# Patient Record
Sex: Female | Born: 2000 | Race: White | Hispanic: No | Marital: Single | Smoking: Never smoker
Health system: Southern US, Community
[De-identification: ages and names within clinical notes are randomized; demographics above are authoritative.]

## PROBLEM LIST (undated history)

## (undated) DIAGNOSIS — F909 Attention-deficit hyperactivity disorder, unspecified type: Secondary | ICD-10-CM

---
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
## 2018-01-10 DIAGNOSIS — L509 Urticaria, unspecified: Secondary | ICD-10-CM | POA: Diagnosis not present

## 2018-01-10 DIAGNOSIS — M791 Myalgia, unspecified site: Secondary | ICD-10-CM | POA: Diagnosis not present

## 2018-01-30 DIAGNOSIS — Z23 Encounter for immunization: Secondary | ICD-10-CM | POA: Diagnosis not present

## 2018-03-04 DIAGNOSIS — F411 Generalized anxiety disorder: Secondary | ICD-10-CM | POA: Diagnosis not present

## 2018-03-11 DIAGNOSIS — F411 Generalized anxiety disorder: Secondary | ICD-10-CM | POA: Diagnosis not present

## 2018-06-10 DIAGNOSIS — Z113 Encounter for screening for infections with a predominantly sexual mode of transmission: Secondary | ICD-10-CM | POA: Diagnosis not present

## 2018-06-10 DIAGNOSIS — Z118 Encounter for screening for other infectious and parasitic diseases: Secondary | ICD-10-CM | POA: Diagnosis not present

## 2018-06-10 DIAGNOSIS — Z6822 Body mass index (BMI) 22.0-22.9, adult: Secondary | ICD-10-CM | POA: Diagnosis not present

## 2018-06-10 DIAGNOSIS — Z01419 Encounter for gynecological examination (general) (routine) without abnormal findings: Secondary | ICD-10-CM | POA: Diagnosis not present

## 2018-10-09 DIAGNOSIS — R300 Dysuria: Secondary | ICD-10-CM | POA: Diagnosis not present

## 2018-10-09 DIAGNOSIS — N760 Acute vaginitis: Secondary | ICD-10-CM | POA: Diagnosis not present

## 2018-10-20 DIAGNOSIS — N390 Urinary tract infection, site not specified: Secondary | ICD-10-CM | POA: Diagnosis not present

## 2018-10-20 DIAGNOSIS — Z202 Contact with and (suspected) exposure to infections with a predominantly sexual mode of transmission: Secondary | ICD-10-CM | POA: Diagnosis not present

## 2019-01-27 DIAGNOSIS — Z3046 Encounter for surveillance of implantable subdermal contraceptive: Secondary | ICD-10-CM | POA: Diagnosis not present

## 2019-01-27 DIAGNOSIS — Z118 Encounter for screening for other infectious and parasitic diseases: Secondary | ICD-10-CM | POA: Diagnosis not present

## 2019-01-27 DIAGNOSIS — Z30017 Encounter for initial prescription of implantable subdermal contraceptive: Secondary | ICD-10-CM | POA: Diagnosis not present

## 2019-01-27 DIAGNOSIS — Z3049 Encounter for surveillance of other contraceptives: Secondary | ICD-10-CM | POA: Diagnosis not present

## 2019-02-04 DIAGNOSIS — Z3046 Encounter for surveillance of implantable subdermal contraceptive: Secondary | ICD-10-CM | POA: Diagnosis not present

## 2019-02-04 DIAGNOSIS — Z118 Encounter for screening for other infectious and parasitic diseases: Secondary | ICD-10-CM | POA: Diagnosis not present

## 2019-02-10 DIAGNOSIS — R21 Rash and other nonspecific skin eruption: Secondary | ICD-10-CM | POA: Diagnosis not present

## 2019-06-16 DIAGNOSIS — J029 Acute pharyngitis, unspecified: Secondary | ICD-10-CM | POA: Diagnosis not present

## 2019-06-16 DIAGNOSIS — Z20828 Contact with and (suspected) exposure to other viral communicable diseases: Secondary | ICD-10-CM | POA: Diagnosis not present

## 2019-10-16 DIAGNOSIS — S63642A Sprain of metacarpophalangeal joint of left thumb, initial encounter: Secondary | ICD-10-CM | POA: Diagnosis not present

## 2019-10-16 DIAGNOSIS — S6992XA Unspecified injury of left wrist, hand and finger(s), initial encounter: Secondary | ICD-10-CM | POA: Diagnosis not present

## 2019-10-16 DIAGNOSIS — W19XXXA Unspecified fall, initial encounter: Secondary | ICD-10-CM | POA: Diagnosis not present

## 2019-10-20 DIAGNOSIS — M79645 Pain in left finger(s): Secondary | ICD-10-CM | POA: Diagnosis not present

## 2019-11-05 DIAGNOSIS — M79645 Pain in left finger(s): Secondary | ICD-10-CM | POA: Diagnosis not present

## 2021-01-09 DIAGNOSIS — R195 Other fecal abnormalities: Secondary | ICD-10-CM | POA: Diagnosis not present

## 2021-01-09 DIAGNOSIS — R197 Diarrhea, unspecified: Secondary | ICD-10-CM | POA: Diagnosis not present

## 2021-01-09 DIAGNOSIS — R140 Abdominal distension (gaseous): Secondary | ICD-10-CM | POA: Diagnosis not present

## 2021-01-10 DIAGNOSIS — R195 Other fecal abnormalities: Secondary | ICD-10-CM | POA: Diagnosis not present

## 2021-01-26 DIAGNOSIS — R197 Diarrhea, unspecified: Secondary | ICD-10-CM | POA: Diagnosis not present

## 2021-01-26 DIAGNOSIS — R195 Other fecal abnormalities: Secondary | ICD-10-CM | POA: Diagnosis not present

## 2021-02-22 DIAGNOSIS — K580 Irritable bowel syndrome with diarrhea: Secondary | ICD-10-CM | POA: Diagnosis not present

## 2021-03-22 DIAGNOSIS — K580 Irritable bowel syndrome with diarrhea: Secondary | ICD-10-CM | POA: Diagnosis not present
